# Patient Record
Sex: Male | Born: 2000 | Race: White | Hispanic: No | Marital: Single | Smoking: Never smoker
Health system: Southern US, Community
[De-identification: ages and names within clinical notes are randomized; demographics above are authoritative.]

## PROBLEM LIST (undated history)

## (undated) DIAGNOSIS — F329 Major depressive disorder, single episode, unspecified: Secondary | ICD-10-CM

## (undated) DIAGNOSIS — F32A Depression, unspecified: Secondary | ICD-10-CM

## (undated) DIAGNOSIS — F845 Asperger's syndrome: Secondary | ICD-10-CM

## (undated) DIAGNOSIS — F419 Anxiety disorder, unspecified: Secondary | ICD-10-CM

---
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
## 2020-02-02 ENCOUNTER — Other Ambulatory Visit (INDEPENDENT_AMBULATORY_CARE_PROVIDER_SITE_OTHER): Payer: Self-pay | Admitting: Pediatric Endocrinology

## 2020-03-15 ENCOUNTER — Other Ambulatory Visit: Payer: Self-pay

## 2020-03-15 ENCOUNTER — Ambulatory Visit: Payer: Self-pay | Attending: Internal Medicine

## 2020-03-15 DIAGNOSIS — Z23 Encounter for immunization: Secondary | ICD-10-CM

## 2020-03-15 NOTE — Progress Notes (Signed)
   Covid-19 Vaccination Clinic  Name:  Alison Breeding    MRN: [id number] DOB: 01/12/2001  03/15/2020  Ms. Vignola was observed post Covid-19 immunization for 30 minutes based on pre-vaccination screening without incident. She was provided with Vaccine Information Sheet and instruction to access the V-Safe system.   Ms. Murdaugh was instructed to call 911 with any severe reactions post vaccine: Marland Kitchen Difficulty breathing  . Swelling of face and throat  . A fast heartbeat  . A bad rash all over body  . Dizziness and weakness   Immunizations Administered    Name Date Dose VIS Date Route   Pfizer COVID-19 Vaccine 03/15/2020  4:52 PM 0.3 mL 11/10/2019 Intramuscular   Manufacturer: [employer], [employer]   Lot: [id number]   NDC: [address]-1

## 2020-04-08 ENCOUNTER — Ambulatory Visit: Payer: Self-pay | Attending: Internal Medicine

## 2020-04-08 DIAGNOSIS — Z23 Encounter for immunization: Secondary | ICD-10-CM

## 2020-04-08 NOTE — Progress Notes (Signed)
   Covid-19 Vaccination Clinic  Name:  Johnathan Taylor    MRN: [id number] DOB: December 20, 2000  04/08/2020  Johnathan Taylor was observed post Covid-19 immunization for 15 minutes without incident. She was provided with Vaccine Information Sheet and instruction to access the V-Safe system.   Johnathan Taylor was instructed to call 911 with any severe reactions post vaccine: Marland Kitchen Difficulty breathing  . Swelling of face and throat  . A fast heartbeat  . A bad rash all over body  . Dizziness and weakness   Immunizations Administered    Name Date Dose VIS Date Route   Pfizer COVID-19 Vaccine 04/08/2020  4:18 PM 0.3 mL 01/24/2019 Intramuscular   Manufacturer: [employer], [employer]   Lot: [id number]   NDC: [address]-3
